# Patient Record
Sex: Female | Born: 1955 | Race: Black or African American | Hispanic: No | Smoking: Never smoker
Health system: Southern US, Community
[De-identification: ages and names within clinical notes are randomized; demographics above are authoritative.]

## PROBLEM LIST (undated history)

## (undated) DIAGNOSIS — Z860100 Personal history of colon polyps, unspecified: Secondary | ICD-10-CM

## (undated) DIAGNOSIS — F419 Anxiety disorder, unspecified: Secondary | ICD-10-CM

## (undated) DIAGNOSIS — E785 Hyperlipidemia, unspecified: Secondary | ICD-10-CM

## (undated) HISTORY — DX: Anxiety disorder, unspecified: F41.9

## (undated) HISTORY — DX: Hyperlipidemia, unspecified: E78.5

## (undated) HISTORY — DX: Personal history of colonic polyps: Z86.010

## (undated) HISTORY — DX: Personal history of colon polyps, unspecified: Z86.0100

---

## 2000-10-03 ENCOUNTER — Encounter: Payer: Self-pay | Admitting: Family Medicine

## 2000-10-03 ENCOUNTER — Encounter: Admission: RE | Admit: 2000-10-03 | Discharge: 2000-10-03 | Payer: Self-pay | Admitting: Family Medicine

## 2001-04-23 ENCOUNTER — Other Ambulatory Visit: Admission: RE | Admit: 2001-04-23 | Discharge: 2001-04-23 | Payer: Self-pay

## 2001-10-29 ENCOUNTER — Ambulatory Visit (HOSPITAL_COMMUNITY): Admission: RE | Admit: 2001-10-29 | Discharge: 2001-10-29 | Payer: Self-pay

## 2001-10-29 ENCOUNTER — Encounter (INDEPENDENT_AMBULATORY_CARE_PROVIDER_SITE_OTHER): Payer: Self-pay | Admitting: Specialist

## 2007-07-18 ENCOUNTER — Other Ambulatory Visit: Admission: RE | Admit: 2007-07-18 | Discharge: 2007-07-18 | Payer: Self-pay | Admitting: Gynecology

## 2007-07-19 ENCOUNTER — Encounter (INDEPENDENT_AMBULATORY_CARE_PROVIDER_SITE_OTHER): Payer: Self-pay | Admitting: Gynecology

## 2007-07-19 ENCOUNTER — Inpatient Hospital Stay (HOSPITAL_COMMUNITY): Admission: AD | Admit: 2007-07-19 | Discharge: 2007-07-19 | Payer: Self-pay | Admitting: Gynecology

## 2007-08-04 ENCOUNTER — Inpatient Hospital Stay (HOSPITAL_COMMUNITY): Admission: AD | Admit: 2007-08-04 | Discharge: 2007-08-06 | Payer: Self-pay | Admitting: Gynecology

## 2007-08-04 ENCOUNTER — Ambulatory Visit: Payer: Self-pay | Admitting: Gynecology

## 2007-08-05 ENCOUNTER — Encounter (INDEPENDENT_AMBULATORY_CARE_PROVIDER_SITE_OTHER): Payer: Self-pay | Admitting: Gynecology

## 2007-09-04 ENCOUNTER — Ambulatory Visit: Payer: Self-pay | Admitting: Gynecology

## 2010-11-22 NOTE — Discharge Summary (Signed)
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   IN HOSPITAL PROCEDURES:  Laparoscopically assisted vaginal hysterectomy  and bilateral salpingo-oophorectomy.   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   HOSPITAL COURSE:  This patient is a 55 year old African-American female,  gravida 2, para 2-0-0-2 who underwent the aforementioned procedure on  August 05, 2007. At surgery, the patient had 2 units of packed red  blood cells administered. The patient had been seen earlier in the past  with a hemoglobin of 4.7 and then 5.3 approximately 4-6 weeks prior. The  patient on admission August 04, 2007 had a hemoglobin of 8.7 and  hematocrit of 28.8. intraoperative course was unremarkable.  Postoperatively she did well. She was afebrile, voided well with  catheter removal, incision was dry, scant vaginal flow. Lungs clear and  calves without tenderness. Her abdomen was soft. The patient was  provided discharge instructions including contacting the office for  temperature elevation of 100.4 degrees Fahrenheit, increasing abdominal  or incisional pain, GI or GU complaints or any other symptomatology. She  was prescribed Vicodin 1-2 every 4-6 h as needed for pain. At this time,  she declined Premarin. The patient will be followed at the GYN clinic  and asked to make an appointment to be seen in 4 weeks. All questions  answered to the satisfaction of the patient. The patient verbalized an  understanding of the same.      Ginger Carne, MD  Electronically  Signed     SHB/MEDQ  D:  08/06/2007  T:  08/06/2007  Job:  [id number]

## 2010-11-22 NOTE — Op Note (Signed)
NAMEREIZY, DUNLOW             ACCOUNT NO.:  [id number]   MEDICAL RECORD NO.:  [id number]          PATIENT TYPE:  INP   LOCATION:  9305                          FACILITY:  [facility]   PHYSICIAN:  Ginger Carne, MD  DATE OF BIRTH:  Oct 09, 1955   DATE OF PROCEDURE:  08/05/2007  DATE OF DISCHARGE:                               OPERATIVE REPORT   PREOPERATIVE DIAGNOSES:  16-week leiomyomatous uterus and  menometrorrhagia.   POSTOPERATIVE DIAGNOSES:  16-week leiomyomatous uterus and  menometrorrhagia.   PROCEDURE:  Laparoscopic-assisted vaginal hysterectomy, bilateral  salpingo-oophorectomy.   SURGEON:  Ginger Carne, MD.   ASSISTANTMichele Mcalpine D. Rose, MD.   ESTIMATED BLOOD LOSS:  500 mL.   COMPLICATIONS:  None immediate.   SPECIMEN:  Uterus and cervix.   DISPOSITION:  To pathology.   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  Both tubes and ovaries were within normal limits.  No other  pathology noted in pelvis. External genitalia, vulva and vagina were  normal.   OPERATIVE PROCEDURE:  The patient prepped and draped in the usual  fashion and placed in the lithotomy position.  Betadine solution used  for antiseptic and the patient was catheterized prior to the procedure.  After adequate general anesthesia, a tenaculum placed on the anterior  lip of the cervix and the [facility] uterine manipulator  placed in the  same.  Afterwards a vertical infraumbilical incision was made and a  Veress needle placed in the abdomen.  Opening and closing pressures were  10-15 mmHg. A needle release trocar placed in the same incision,  laparoscope placed in the trocar sleeve. Two 5 mm ports were made in the  left lower quadrant and left hypogastric regions respectively.  Following this, the ureters were identified bilaterally.  The  infundibulopelvic ligaments were identified and bipolar cauterized and  cut. This also included the round ligaments on either  side.  At this  point,  the vaginal portion of the procedure was conducted.   A double tooth tenaculum placed in the anterior and posterior lips of  the cervix.  Marcaine with epinephrine was used as a paracervical block.  2 cm of anterior and posterior vaginal epithelium were incised  transversely.  Peritoneal reflections identified and opened without  injury to their respective organs. The uterosacral cardinal ligament  complexes were clamped, cut and ligated with #0 Vicryl suture. In a  standard Richardson fashion, the uterine vasculature was clamped, cut  and ligated with #0 Vicryl suture including the broad ligaments. At this  point, the corpus of the uterus, cervix, both tubes and ovaries were  removed.  Bleeding points hemostatically checked, blood clots removed.  Closure of the cuff in one layer with #0 Vicryl running interlocking  suture. Angled sutures placed separately and sutures on the cuff were  started at the angles and worked to the midline for two separate suture  closures.  At this point, reinspection of the pelvis by laparoscopy was  carried out, irrigation utilized, bleeding points hemostatically  checked. The pelvis was dry, no active bleeding noted. Pedicles were  dry, irrigant removed, gas released, trocars removed.  Closure of the 10  mm fascia site with #0 Vicryl suture and a 4-0 Vicryl for subcuticular  closure. Instrument and sponge count were correct.  The patient  tolerated the procedure well and returned to post anesthesia recovery  room in excellent condition.      Ginger Carne, MD  Electronically Signed     SHB/MEDQ  D:  08/05/2007  T:  08/05/2007  Job:  [id number]

## 2010-11-25 NOTE — Op Note (Signed)
[facility]  Patient:    LASHAUNDRA, LEHRMANN Visit Number: [id number] MRN: [id number]          Service Type: DSU Location: [facility] Attending Physician:  Barbaraann Cao Dictated by:   Ronda Fairly. Galen Daft, M.D. Proc. Date: 10/29/01 Admit Date:  10/29/2001                             Operative Report  PREOPERATIVE DIAGNOSES:       1. Menometrorrhagia.                               2. Fibroids.  POSTOPERATIVE DIAGNOSES:      1. Menometrorrhagia.                               2. Fibroids.                               3. Uterine polyp.  OPERATION/PROCEDURE:          Diagnostic hysteroscopy with resection of polyp and uterine curettage.  SURGEON:                      Ronda Fairly. Galen Daft, M.D.  COMPLICATIONS:                None.  ESTIMATED BLOOD LOSS:         Minimal.  PROCEDURE:                    The patient was identified as Nyoka Lint. Informed consent was obtained prior to the procedure.  She was brought to the operating room.  The cervix was dilated to accept the hysteroscope.  The sound showed the uterus to be 13 cm in depth.  The hysteroscope was placed in and both fallopian tube ostia were identified.  Photographs were taken.  There was polypoid-like tissue on the posterior aspect of the uterus, otherwise unremarkable.  Specimen was taken from there.  Also, curettage was carried out throughout.  There were no complications.  It was tolerated well.  Total fluid used was 120 cc, and the patient had no complications.  All instrument, sponge, and needle counts were correct at the end of the case.  The patient had a uterine curettage of all quadrants of the uterus and the sample was sent separately from the hysteroscopy sample.  No complications. Dictated by:   Ronda Fairly. Galen Daft, M.D. Attending Physician:  Barbaraann Cao DD:  10/29/01 TD:  10/30/01 Job: [id number]

## 2011-03-29 LAB — CBC
HCT: 20.9 — ABNORMAL LOW
Hemoglobin: 5.9 — CL
MCHC: 28.1 — ABNORMAL LOW
MCV: 51.9 — ABNORMAL LOW
Platelets: 175
RBC: 4.03
RDW: 21.7 — ABNORMAL HIGH
WBC: 6.5

## 2011-03-29 LAB — TYPE AND SCREEN
ABO/RH(D): O POS
Antibody Screen: NEGATIVE

## 2011-03-29 LAB — FERRITIN: Ferritin: 4 — ABNORMAL LOW (ref 10–291)

## 2011-03-29 LAB — ABO/RH: ABO/RH(D): O POS

## 2011-03-30 LAB — CBC
HCT: 27.1 — ABNORMAL LOW
HCT: 28.8 — ABNORMAL LOW
HCT: 32.1 — ABNORMAL LOW
Hemoglobin: 8.4 — ABNORMAL LOW
Hemoglobin: 8.7 — ABNORMAL LOW
Hemoglobin: 9.9 — ABNORMAL LOW
MCHC: 30.2
MCHC: 30.8
MCHC: 31.1
MCV: 65.6 — ABNORMAL LOW
MCV: 70.7 — ABNORMAL LOW
MCV: 70.9 — ABNORMAL LOW
Platelets: 434 — ABNORMAL HIGH
Platelets: 469 — ABNORMAL HIGH
Platelets: 492 — ABNORMAL HIGH
RBC: 3.83 — ABNORMAL LOW
RBC: 4.39
RBC: 4.54
RDW: 38.9 — ABNORMAL HIGH
RDW: 39.2 — ABNORMAL HIGH
RDW: 40.8 — ABNORMAL HIGH
WBC: 6.1
WBC: 6.4
WBC: 8.9

## 2011-03-30 LAB — BASIC METABOLIC PANEL
BUN: 16
BUN: 17
BUN: 6
CO2: 25
CO2: 25
CO2: 31
Calcium: 8.3 — ABNORMAL LOW
Calcium: 8.9
Calcium: 9.2
Chloride: 103
Chloride: 107
Chloride: 109
Creatinine, Ser: 0.68
Creatinine, Ser: 0.84
Creatinine, Ser: 0.94
GFR calc Af Amer: >60
GFR calc Af Amer: >60
GFR calc Af Amer: >60
GFR calc non Af Amer: >60
GFR calc non Af Amer: >60
GFR calc non Af Amer: >60
Glucose, Bld: 113 — ABNORMAL HIGH
Glucose, Bld: 130 — ABNORMAL HIGH
Glucose, Bld: 90
Potassium: 3.7
Potassium: 3.9
Potassium: 4.2
Sodium: 136
Sodium: 138
Sodium: 141

## 2011-03-30 LAB — CROSSMATCH
ABO/RH(D): O POS
Antibody Screen: NEGATIVE

## 2013-07-31 ENCOUNTER — Telehealth: Payer: Self-pay

## 2013-07-31 NOTE — Telephone Encounter (Signed)
Patient would like Dr. Audria NineMcPherson to call her. She is having neck pain and would like some advice as to what to do for it. She has an appointment for a CPE next month but she cannot wait that long to discuss her symptoms. I let her know we would not be able to give her any medical advice over the phone without an OV, but the patient would like Dr. Leonia ReaderMc Pherson to call her anyway.

## 2013-07-31 NOTE — Telephone Encounter (Signed)
I have tried the phone number we have listed for home; it is not a valid number. This person is not established with us; if she needs evaluation, walk-in @ [facility] is advised.

## 2013-08-04 NOTE — Telephone Encounter (Signed)
Invalid number. Patient not established with us. Cannot do medical advice over phone.

## 2013-08-21 ENCOUNTER — Ambulatory Visit: Payer: Self-pay | Admitting: Family Medicine

## 2013-09-29 ENCOUNTER — Other Ambulatory Visit (HOSPITAL_COMMUNITY)
Admission: RE | Admit: 2013-09-29 | Discharge: 2013-09-29 | Disposition: A | Discharge status: Home / Self Care | Payer: 59 | Source: Ambulatory Visit | Attending: Family Medicine | Admitting: Family Medicine

## 2013-09-29 ENCOUNTER — Other Ambulatory Visit: Payer: Self-pay | Admitting: Family Medicine

## 2013-09-29 DIAGNOSIS — Z01419 Encounter for gynecological examination (general) (routine) without abnormal findings: Secondary | ICD-10-CM | POA: Insufficient documentation

## 2013-09-30 ENCOUNTER — Other Ambulatory Visit: Payer: Self-pay

## 2013-09-30 DIAGNOSIS — Z1231 Encounter for screening mammogram for malignant neoplasm of breast: Secondary | ICD-10-CM

## 2013-10-08 ENCOUNTER — Ambulatory Visit: Payer: Self-pay

## 2013-12-10 ENCOUNTER — Ambulatory Visit
Admission: RE | Admit: 2013-12-10 | Discharge: 2013-12-10 | Disposition: A | Discharge status: Home / Self Care | Payer: 59 | Source: Ambulatory Visit

## 2013-12-10 ENCOUNTER — Encounter (INDEPENDENT_AMBULATORY_CARE_PROVIDER_SITE_OTHER): Payer: Self-pay

## 2013-12-10 DIAGNOSIS — Z1231 Encounter for screening mammogram for malignant neoplasm of breast: Secondary | ICD-10-CM

## 2015-03-10 ENCOUNTER — Other Ambulatory Visit: Payer: Self-pay

## 2015-03-10 DIAGNOSIS — Z1231 Encounter for screening mammogram for malignant neoplasm of breast: Secondary | ICD-10-CM

## 2015-04-01 ENCOUNTER — Ambulatory Visit: Payer: 59

## 2015-07-20 ENCOUNTER — Ambulatory Visit
Admission: RE | Admit: 2015-07-20 | Discharge: 2015-07-20 | Disposition: A | Discharge status: Home / Self Care | Payer: 59 | Source: Ambulatory Visit

## 2015-07-20 DIAGNOSIS — Z1231 Encounter for screening mammogram for malignant neoplasm of breast: Secondary | ICD-10-CM

## 2016-07-25 ENCOUNTER — Other Ambulatory Visit: Payer: Self-pay | Admitting: Family Medicine

## 2016-07-25 DIAGNOSIS — Z1231 Encounter for screening mammogram for malignant neoplasm of breast: Secondary | ICD-10-CM

## 2016-08-07 ENCOUNTER — Encounter: Payer: Self-pay | Admitting: Radiology

## 2016-08-07 ENCOUNTER — Ambulatory Visit
Admission: RE | Admit: 2016-08-07 | Discharge: 2016-08-07 | Disposition: A | Discharge status: Home / Self Care | Payer: 59 | Source: Ambulatory Visit | Attending: Family Medicine | Admitting: Family Medicine

## 2016-08-07 DIAGNOSIS — Z1231 Encounter for screening mammogram for malignant neoplasm of breast: Secondary | ICD-10-CM

## 2016-11-21 ENCOUNTER — Institutional Professional Consult (permissible substitution): Payer: 59 | Admitting: Internal Medicine

## 2016-12-27 ENCOUNTER — Encounter: Payer: Self-pay | Admitting: Internal Medicine

## 2016-12-27 ENCOUNTER — Ambulatory Visit (INDEPENDENT_AMBULATORY_CARE_PROVIDER_SITE_OTHER): Payer: Commercial Managed Care - PPO | Admitting: Internal Medicine

## 2016-12-27 ENCOUNTER — Other Ambulatory Visit (INDEPENDENT_AMBULATORY_CARE_PROVIDER_SITE_OTHER): Payer: Commercial Managed Care - PPO

## 2016-12-27 ENCOUNTER — Ambulatory Visit (INDEPENDENT_AMBULATORY_CARE_PROVIDER_SITE_OTHER)
Admission: RE | Admit: 2016-12-27 | Discharge: 2016-12-27 | Disposition: A | Discharge status: Home / Self Care | Payer: Commercial Managed Care - PPO | Source: Ambulatory Visit | Attending: Internal Medicine | Admitting: Internal Medicine

## 2016-12-27 VITALS — BP 124/78 | HR 74 | Ht 67.0 in | Wt 174.0 lb

## 2016-12-27 DIAGNOSIS — J45991 Cough variant asthma: Secondary | ICD-10-CM

## 2016-12-27 DIAGNOSIS — R05 Cough: Secondary | ICD-10-CM | POA: Diagnosis not present

## 2016-12-27 LAB — CBC WITH DIFFERENTIAL/PLATELET
Basophils Absolute: 0.1 10*3/uL (ref 0.0–0.1)
Basophils Relative: 0.8 % (ref 0.0–3.0)
Eosinophils Absolute: 0.2 10*3/uL (ref 0.0–0.7)
Eosinophils Relative: 2.5 % (ref 0.0–5.0)
HCT: 43.8 % (ref 36.0–46.0)
Hemoglobin: 14.3 g/dL (ref 12.0–15.0)
Lymphocytes Relative: 25.2 % (ref 12.0–46.0)
Lymphs Abs: 1.8 10*3/uL (ref 0.7–4.0)
MCHC: 32.7 g/dL (ref 30.0–36.0)
MCV: 83.3 fl (ref 78.0–100.0)
Monocytes Absolute: 0.5 10*3/uL (ref 0.1–1.0)
Monocytes Relative: 7.2 % (ref 3.0–12.0)
Neutro Abs: 4.6 10*3/uL (ref 1.4–7.7)
Neutrophils Relative %: 64.3 % (ref 43.0–77.0)
Platelets: 273 10*3/uL (ref 150.0–400.0)
RBC: 5.25 Mil/uL — ABNORMAL HIGH (ref 3.87–5.11)
RDW: 12.6 % (ref 11.5–15.5)
WBC: 7.1 10*3/uL (ref 4.0–10.5)

## 2016-12-27 LAB — NITRIC OXIDE: Nitric Oxide: 16

## 2016-12-27 MED ORDER — FAMOTIDINE 20 MG PO TABS
ORAL_TABLET | ORAL | 11 refills | Status: DC
Start: 2016-12-27 — End: 2017-04-25

## 2016-12-27 MED ORDER — PANTOPRAZOLE SODIUM 40 MG PO TBEC
40.0000 mg | DELAYED_RELEASE_TABLET | Freq: Every day | ORAL | 2 refills | Status: DC
Start: 2016-12-27 — End: 2017-04-25

## 2016-12-27 NOTE — Progress Notes (Signed)
Subjective:     Patient ID: Gabriella Greene, female   DOB: 1956/06/08,    MRN: [id number]  HPI  [id number] yobf never smoker never significant resp problems with new onset cough around summer 2016 referred to pulmonary clinic 12/27/2016 by Gabriella   Wynelle Greene    12/27/2016 1st [facility] office visit/ Gabriella Greene   Chief Complaint  Patient presents with  . Pulmonary Consult    Referred by Gabriella. Deatra JamesVyvyan Greene. She c/o cough for the past 2 yrs. Cough is occ prod but she swallows mucus. 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  She occ is awakened by the cough but not often.   onset was abrupt x 4-5  years prior to OV  Can be very severe but waxes and wanes and better x one month but still present day > noct   [place] terrier x 2.5 y     Kouffman Reflux v Neurogenic Cough Differentiator Reflux Comments  Do you awaken from a sound sleep coughing violently?                            With trouble breathing? 2 x weekly no   Do you have choking episodes when you cannot  Get enough air, gasping for air ?              Yes   Do you usually cough when you lie down into  The bed, or when you just lie down to rest ?                          No    Do you usually cough after meals or eating?         no   Do you cough when (or after) you bend over?    no   GERD SCORE     Kouffman Reflux v Neurogenic Cough Differentiator Neurogenic   Do you more-or-less cough all day long? Sporadic Worse at worse /dillards   Does change of temperature make you cough? no   Does laughing or chuckling cause you to cough? no   Do fumes (perfume, automobile fumes, burned  Toast, etc.,) cause you to cough ?      no   Does speaking, singing, or talking on the phone cause you to cough   ?               No    Neurogenic/Airway score       No obvious day to day or daytime variability or assoc excess/ purulent sputum or mucus plugs or hemoptysis or cp or chest tightness, subjective wheeze or overt sinus or hb symptoms. No unusual exp hx  or h/o childhood pna/ asthma or knowledge of premature birth.  Sleeping ok without nocturnal  or early am exacerbation  of respiratory  c/o's or need for noct saba. Also denies any obvious fluctuation of symptoms with weather or environmental changes or other aggravating or alleviating factors except as outlined above   Current Medications, Allergies, Complete Past Medical History, Past Surgical History, Family History, and Social History were reviewed in Gabriella Greene electronic medical record.  ROS  The following are not active complaints unless bolded sore throat, dysphagia, dental problems, itching, sneezing,  nasal congestion or excess/ purulent secretions, ear ache,   fever, chills, sweats, unintended wt loss, classically pleuritic or exertional cp,  orthopnea pnd or leg swelling,  presyncope, palpitations, abdominal pain, anorexia, nausea, vomiting, diarrhea  or change in bowel or bladder habits, change in stools or urine, dysuria,hematuria,  rash, arthralgias, visual complaints, headache, numbness, weakness or ataxia or problems with walking or coordination,  change in mood/affect or memory.          Review of Systems     Objective:   Physical Exam  amb bf nad   Wt Readings from Last 3 Encounters:  12/27/16 174 lb (78.9 kg)    Vital signs reviewed  - Note on arrival 02 sats  96% on RA     HEENT: nl dentition, turbinates bilaterally, and oropharynx. Severe dry wax/  without cough reflex   NECK :  without JVD/Nodes/TM/ nl carotid upstrokes bilaterally   LUNGS: no acc muscle use,  Nl contour chest which is clear to A and P bilaterally without cough on insp or exp maneuvers   CV:  RRR  no s3 or murmur or increase in P2, and no edema   ABD:  soft and nontender with nl inspiratory excursion in the supine position. No bruits or organomegaly appreciated, bowel sounds nl  MS:  Nl gait/ ext warm without deformities, calf tenderness, cyanosis or clubbing No obvious joint  restrictions   SKIN: warm and dry without lesions    NEURO:  alert, approp, nl sensorium with  no motor or cerebellar deficits apparent.     CXR PA and Lateral:   12/27/2016 :    I personally reviewed images and agree with radiology impression as follows:    Borderline cardiomegaly.  No acute pulmonary disease.  .Labs ordered 12/27/2016   Allergy profile     Assessment:

## 2016-12-27 NOTE — Patient Instructions (Addendum)
For drainage / throat tickle try take CHLORPHENIRAMINE  4 mg - take one every 4 hours as needed - available over the counter- may cause drowsiness so start with just a bedtime dose or two and see how you tolerate it before trying in daytime  - if can't tolerate the chlorpheniramine then try zyrtec   Pantoprazole (protonix) 40 mg   Take  30-60 min before first meal of the day and Pepcid (famotidine)  20 mg one @  bedtime until return to office - this is the best way to tell whether stomach acid is contributing to your problem.    GERD (REFLUX)  is an extremely common cause of respiratory symptoms just like yours , many times with no obvious heartburn at all.    It can be treated with medication, but also with lifestyle changes including elevation of the head of your bed (ideally with 6 inch  bed blocks),  Smoking cessation, avoidance of late meals, excessive alcohol, and avoid fatty foods, chocolate, peppermint, colas, red wine, and acidic juices such as orange juice.  NO MINT OR MENTHOL PRODUCTS SO NO COUGH DROPS   USE SUGARLESS CANDY INSTEAD (Jolley ranchers or Stover's or Life Savers) or even ice chips will also do - the key is to swallow to prevent all throat clearing. NO OIL BASED VITAMINS - use powdered substitutes - stop fish oil for now and eat more fish!    Please remember to go to the lab and x-ray department downstairs in the basement  for your tests - we will call you with the results when they are available.    Please schedule a follow up office visit in 6 weeks, call sooner if needed

## 2016-12-28 LAB — RESPIRATORY ALLERGY PROFILE REGION II ~~LOC~~
Allergen, A. alternata, m6: <0.1 kU/L
Allergen, C. Herbarum, M2: <0.1 kU/L
Allergen, Cedar tree, t12: <0.1 kU/L
Allergen, Comm Silver Birch, t9: <0.1 kU/L
Allergen, Cottonwood, t14: <0.1 kU/L
Allergen, D pternoyssinus,d7: 0.11 kU/L — ABNORMAL HIGH
Allergen, Mouse Urine Protein, e78: <0.1 kU/L
Allergen, Mulberry, t76: <0.1 kU/L
Allergen, Oak,t7: 0.16 kU/L — ABNORMAL HIGH
Allergen, P. notatum, m1: <0.1 kU/L
Aspergillus fumigatus, m3: <0.1 kU/L
Bermuda Grass: 0.11 kU/L — ABNORMAL HIGH
Box Elder IgE: <0.1 kU/L
Cat Dander: <0.1 kU/L
Cockroach: 0.12 kU/L — ABNORMAL HIGH
Common Ragweed: <0.1 kU/L
D. farinae: <0.1 kU/L
Dog Dander: <0.1 kU/L
Elm IgE: 0.1 kU/L — ABNORMAL HIGH
IgE (Immunoglobulin E), Serum: 60 kU/L (ref <115)
Johnson Grass: <0.1 kU/L
Pecan/Hickory Tree IgE: <0.1 kU/L
Rough Pigweed  IgE: <0.1 kU/L
Sheep Sorrel IgE: <0.1 kU/L
Timothy Grass: <0.1 kU/L

## 2016-12-28 NOTE — Assessment & Plan Note (Addendum)
FENO 12/27/2016  =   16 - Spirometry 12/27/2016  No obst  - Allergy profile 12/27/2016 >  Eos 0.2 /  IgE   pending   The most common causes of chronic cough in immunocompetent adults include the following: upper airway cough syndrome (UACS), previously referred to as postnasal drip syndrome (PNDS), which is caused by variety of rhinosinus conditions; (2) asthma; (3) GERD; (4) chronic bronchitis from cigarette smoking or other inhaled environmental irritants; (5) nonasthmatic eosinophilic bronchitis; and (6) bronchiectasis.   These conditions, singly or in combination, have accounted for up to 94% of the causes of chronic cough in prospective studies.   Other conditions have constituted no >6% of the causes in prospective studies These have included bronchogenic carcinoma, chronic interstitial pneumonia, sarcoidosis, left ventricular failure, ACEI-induced cough, and aspiration from a condition associated with pharyngeal dysfunction.    Chronic cough is often simultaneously caused by more than one condition. A single cause has been found from 38 to 82% of the time, multiple causes from 18 to 62%. Multiply caused cough has been the result of three diseases up to 42% of the time.       Most likely this is not asthma but rather  Upper airway cough syndrome (previously labeled PNDS) , is  so named because it's frequently impossible to sort out how much is  CR/sinusitis with freq throat clearing (which can be related to primary GERD)   vs  causing  secondary (" extra esophageal")  GERD from wide swings in gastric pressure that occur with throat clearing, often  promoting self use of mint and menthol lozenges that reduce the lower esophageal sphincter tone and exacerbate the problem further in a cyclical fashion.   These are the same pts (now being labeled as having "irritable larynx syndrome" by some cough centers) who not infrequently have a history of having failed to tolerate ace inhibitors,  dry powder  inhalers or biphosphonates or report having atypical/extraesophageal reflux symptoms that don't respond to standard doses of PPI  and are easily confused as having aecopd or asthma flares by even experienced allergists/ pulmonologists (myself included).  Of the three most common causes of  Sub-acute or recurrent or chronic cough, only one (GERD)  can actually contribute to/ trigger  the other two (asthma and post nasal drip syndrome)  and perpetuate the cylce of cough.  While not intuitively obvious, many patients with chronic low grade reflux do not cough until there is a primary insult that disturbs the protective epithelial barrier and exposes sensitive nerve endings.   This is typically viral but can be direct physical injury such as with an endotracheal tube.   The point is that once this occurs, it is difficult to eliminate the cycle  using anything but a maximally effective acid suppression regimen at least in the short run, accompanied by an appropriate diet to address non acid GERD and control / eliminate the cough itself for at least 3 days.    Will try max rx for gerd/pnds with 1st gen h1 prn  x 6 weeks then regroup with allergy w/u in meantime if indicated by intial screening    Advised: The standardized cough guidelines published in Chest by Stark Fallsichard Irwin in 2006 are still the best available and consist of a multiple step process (up to 12!) , not a single office visit,  and are intended  to address this problem logically,  with an alogrithm dependent on response to empiric treatment at  each  progressive step  to determine a specific diagnosis with  minimal addtional testing needed. Therefore if adherence is an issue or can't be accurately verified,  it's very unlikely the standard evaluation and treatment will be successful here.    Furthermore, response to therapy (other than acute cough suppression, which should only be used short term with avoidance of narcotic containing cough syrups if  possible), can be a gradual process for which the patient is not likely to  perceive immediate benefit.  Unlike going to an eye doctor where the best perscription is almost always the first one and is immediately effective, this is almost never the case in the management of chronic cough syndromes. Therefore the patient needs to commit up front to consistently adhere to recommendations  for up to 6 weeks of therapy directed at the likely underlying problem(s) before the response can be reasonably evaluated.    Total time devoted to counseling  > 50 % of initial 60 min office visit:  review case with pt/ discussion of options/alternatives/ personally creating written customized instructions  in presence of pt  then going over those specific  Instructions directly with the pt including how to use all of the meds but in particular covering each new medication in detail and the difference between the maintenance= "automatic" meds and the prns using an action plan format for the latter (If this problem/symptom => do that organization reading Left to right).  Please see AVS from this visit for a full list of these instructions which I personally wrote for this pt and  are unique to this visit.

## 2016-12-28 NOTE — Progress Notes (Signed)
Spoke with pt and notified of results per Dr. Wert. Pt verbalized understanding and denied any questions. 

## 2017-01-01 NOTE — Progress Notes (Signed)
LMTCB

## 2017-01-02 NOTE — Progress Notes (Signed)
LMTCB

## 2017-01-03 NOTE — Progress Notes (Signed)
LMTCB

## 2017-01-04 ENCOUNTER — Encounter: Payer: Self-pay | Admitting: *Deleted

## 2017-01-04 NOTE — Progress Notes (Signed)
Letter mailed to the pt. 

## 2017-01-12 ENCOUNTER — Telehealth: Payer: Self-pay | Admitting: Internal Medicine

## 2017-01-12 NOTE — Telephone Encounter (Signed)
Notes recorded by Nyoka CowdenWert, Michael B, MD on 01/01/2017 at 8:47 AM EDT Call patient : Studies are pos mild allergy to  trees/grass/ dust > avoidance main rx. Be sure patient has f/u ov so we can go over all the details of this study and get a plan together moving forward - ok to move up f/u if not feeling better and wants to be seen sooner   Left message for patient with results.

## 2017-02-14 ENCOUNTER — Ambulatory Visit: Payer: Commercial Managed Care - PPO | Admitting: Internal Medicine

## 2017-03-14 DIAGNOSIS — H2513 Age-related nuclear cataract, bilateral: Secondary | ICD-10-CM | POA: Diagnosis not present

## 2017-03-14 DIAGNOSIS — S0501XA Injury of conjunctiva and corneal abrasion without foreign body, right eye, initial encounter: Secondary | ICD-10-CM | POA: Diagnosis not present

## 2017-04-25 ENCOUNTER — Encounter: Payer: Self-pay | Admitting: Internal Medicine

## 2017-04-25 ENCOUNTER — Ambulatory Visit (INDEPENDENT_AMBULATORY_CARE_PROVIDER_SITE_OTHER): Payer: Commercial Managed Care - PPO | Admitting: Internal Medicine

## 2017-04-25 ENCOUNTER — Ambulatory Visit: Payer: Commercial Managed Care - PPO | Admitting: Internal Medicine

## 2017-04-25 VITALS — BP 124/80 | HR 66 | Ht 67.0 in | Wt 168.4 lb

## 2017-04-25 DIAGNOSIS — J45991 Cough variant asthma: Secondary | ICD-10-CM

## 2017-04-25 NOTE — Progress Notes (Signed)
Subjective:     Patient ID: Gabriella Greene, female   DOB: 09/23/1955,    MRN: [id number]    Brief patient profile:  [age] yobf never smoker never significant resp problems with new onset cough around summer 2016 referred to pulmonary clinic 12/27/2016 by Dr   Gabriella Greene     History of Present Illness  12/27/2016 1st [facility] office visit/ Gabriella Greene   Chief Complaint  Patient presents with  . Pulmonary Consult    Referred by Dr. Deatra JamesVyvyan Greene. She c/o cough for the past 2 yrs. Cough is occ prod but she swallows mucus. 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  She occ is awakened by the cough but not often.   onset was abrupt x 4-5  years prior to OV  Can be very severe but waxes and wanes and better x one month but still present day > noct   [place] terrier x 2.5 y   Kouffman Reflux v Neurogenic Cough Differentiator Reflux Comments  Do you awaken from a sound sleep coughing violently?                            With trouble breathing? 2 x weekly no   Do you have choking episodes when you cannot  Get enough air, gasping for air ?              Yes   Do you usually cough when you lie down into  The bed, or when you just lie down to rest ?                          No    Do you usually cough after meals or eating?         no   Do you cough when (or after) you bend over?    no   GERD SCORE     Kouffman Reflux v Neurogenic Cough Differentiator Neurogenic   Do you more-or-less cough all day long? Sporadic Worse at worse /dillards   Does change of temperature make you cough? no   Does laughing or chuckling cause you to cough? no   Do fumes (perfume, automobile fumes, burned  Toast, etc.,) cause you to cough ?      no   Does speaking, singing, or talking on the phone cause you to cough   ?               No    Neurogenic/Airway score    rec  For drainage / throat tickle try take CHLORPHENIRAMINE  4 mg - take one every 4 hours as needed - available over the counter- may cause drowsiness so  start with just a bedtime dose or two and see how you tolerate it before trying in daytime  - if can't tolerate the chlorpheniramine then try zyrtec  Pantoprazole (protonix) 40 mg   Take  30-60 min before first meal of the day and Pepcid (famotidine)  20 mg one @  bedtime until return to office - this is the best way to tell whether stomach acid is contributing to your problem.  GERD diet   Please remember to go to the lab and x-ray department downstairs in the basement  for your tests - we will call you with the results when they are available.    04/25/2017  f/u ov/Gabriella Greene re: chronic cough resolved on gerd  rx/ diet  Chief Complaint  Patient presents with  . Follow-up    Cough has resolved. No new co's.    stopped acid suppression p rx x one month and followed diet including no fish oil an chronic (> 1 y) cough  resolved and did not recur   Not limited by breathing from desired activities  No obvious day to day or daytime variability or assoc excess/ purulent sputum or mucus plugs or hemoptysis or cp or chest tightness, subjective wheeze or overt sinus or hb symptoms. No unusual exp hx or h/o childhood pna/ asthma or knowledge of premature birth.  Sleeping ok flat without nocturnal  or early am exacerbation  of respiratory  c/o's or need for noct saba. Also denies any obvious fluctuation of symptoms with weather or environmental changes or other aggravating or alleviating factors except as outlined above   Current Allergies, Complete Past Medical History, Past Surgical History, Family History, and Social History were reviewed in [employer] record.  ROS  The following are not active complaints unless bolded Hoarseness, sore throat, dysphagia, dental problems, itching, sneezing,  nasal congestion or discharge of excess mucus or purulent secretions, ear ache,   fever, chills, sweats, unintended wt loss or wt gain, classically pleuritic or exertional cp,  orthopnea pnd or leg  swelling, presyncope, palpitations, abdominal pain, anorexia, nausea, vomiting, diarrhea  or change in bowel habits or change in bladder habits, change in stools or change in urine, dysuria, hematuria,  rash, arthralgias, visual complaints, headache, numbness, weakness or ataxia or problems with walking or coordination,  change in mood/affect or memory.        Current Meds  Medication Sig  . Cholecalciferol (VITAMIN D PO) Take 1 tablet by mouth daily.  . Multiple Vitamin (MULTIVITAMIN) tablet Take 1 tablet by mouth daily.               Objective:   Physical Exam  amb bf nad    04/25/2017     168   12/27/16 174 lb (78.9 kg)    Vital signs reviewed  - Note on arrival 02 sats  97% on RA     HEENT: nl dentition, turbinates bilaterally, and oropharynx. Severe dry wax/  without cough reflex   NECK :  without JVD/Nodes/TM/ nl carotid upstrokes bilaterally   LUNGS: no acc muscle use,  Nl contour chest which is clear to A and P bilaterally without cough on insp or exp maneuvers   CV:  RRR  no s3 or murmur or increase in P2, and no edema   ABD:  soft and nontender with nl inspiratory excursion in the supine position. No bruits or organomegaly appreciated, bowel sounds nl  MS:  Nl gait/ ext warm without deformities, calf tenderness, cyanosis or clubbing No obvious joint restrictions   SKIN: warm and dry without lesions    NEURO:  alert, approp, nl sensorium with  no motor or cerebellar deficits apparent.     CXR PA and Lateral:   12/27/2016 :    I personally reviewed images and agree with radiology impression as follows:    Borderline cardiomegaly.  No acute pulmonary disease.       Assessment:

## 2017-04-25 NOTE — Assessment & Plan Note (Signed)
FENO 12/27/2016  =   16 - Spirometry 12/27/2016  No obst  - Allergy profile 12/27/2016 >  Eos 0.2 /  IgE   60 RAST pos trees/grass/ dust    Complete resolution of cough though def risk of recurrence  Reviewed: Of the three most common causes of  Sub-acute or recurrent or chronic cough, only one (GERD)  can actually contribute to/ trigger  the other two (asthma and post nasal drip syndrome)  and perpetuate the cylce of cough.  While not intuitively obvious, many patients with chronic low grade reflux do not cough until there is a primary insult that disturbs the protective epithelial barrier and exposes sensitive nerve endings.   This is typically viral or allergic (rhinitis with pnds)  but can also  be direct physical injury such as with an endotracheal tube.   The point is that once this occurs, it is difficult to eliminate the cycle  using anything but a maximally effective acid suppression regimen at least in the short run, accompanied by an appropriate diet to address non acid GERD and control / eliminate the pnds with 1st gen H1 blockers per guidelines     Pulmonary f/u can be prn  - ok to resume fish oil but stop if actively coughing    Each maintenance medication was reviewed in detail including most importantly the difference between maintenance and as needed and under what circumstances the prns are to be used.  Please see AVS for specific  Instructions which are unique to this visit and I personally typed out  which were reviewed in detail in writing with the patient and a copy provided.

## 2017-04-25 NOTE — Patient Instructions (Signed)
Ok to fish oil unless you are coughing   At the onset of cough for any reason:  Try prilosec otc 20mg   Take 30-60 min before first meal of the day and Pepcid ac (famotidine) 20 mg one @  bedtime until cough is completely gone for at least a week without the need for cough suppression    And For drainage / throat tickle try take CHLORPHENIRAMINE  4 mg - take one every 4 hours as needed - available over the counter- may cause drowsiness so start with just a bedtime dose or two and see how you tolerate it before trying in daytime      If you are satisfied with your treatment plan,  let your doctor know and he/she can either refill your medications or you can return here when your prescription runs out.     If in any way you are not 100% satisfied,  please tell us.  If 100% better, tell your friends!  Pulmonary follow up is as needed

## 2017-07-10 DIAGNOSIS — Z760 Encounter for issue of repeat prescription: Secondary | ICD-10-CM | POA: Diagnosis not present

## 2017-08-27 DIAGNOSIS — B301 Conjunctivitis due to adenovirus: Secondary | ICD-10-CM | POA: Diagnosis not present

## 2017-08-27 DIAGNOSIS — H2513 Age-related nuclear cataract, bilateral: Secondary | ICD-10-CM | POA: Diagnosis not present

## 2017-08-29 ENCOUNTER — Other Ambulatory Visit: Payer: Self-pay | Admitting: Family Medicine

## 2017-08-29 DIAGNOSIS — Z1231 Encounter for screening mammogram for malignant neoplasm of breast: Secondary | ICD-10-CM

## 2017-10-10 ENCOUNTER — Ambulatory Visit: Payer: Commercial Managed Care - PPO

## 2017-10-31 ENCOUNTER — Ambulatory Visit
Admission: RE | Admit: 2017-10-31 | Discharge: 2017-10-31 | Disposition: A | Discharge status: Home / Self Care | Payer: Commercial Managed Care - PPO | Source: Ambulatory Visit | Attending: Family Medicine | Admitting: Family Medicine

## 2017-10-31 DIAGNOSIS — Z1231 Encounter for screening mammogram for malignant neoplasm of breast: Secondary | ICD-10-CM | POA: Diagnosis not present

## 2017-11-02 DIAGNOSIS — Z0000 Encounter for general adult medical examination without abnormal findings: Secondary | ICD-10-CM | POA: Diagnosis not present

## 2017-11-02 DIAGNOSIS — E785 Hyperlipidemia, unspecified: Secondary | ICD-10-CM | POA: Diagnosis not present

## 2017-11-02 DIAGNOSIS — H6123 Impacted cerumen, bilateral: Secondary | ICD-10-CM | POA: Diagnosis not present

## 2018-02-27 DIAGNOSIS — R1031 Right lower quadrant pain: Secondary | ICD-10-CM | POA: Diagnosis not present

## 2018-02-28 ENCOUNTER — Ambulatory Visit
Admission: RE | Admit: 2018-02-28 | Discharge: 2018-02-28 | Disposition: A | Discharge status: Home / Self Care | Payer: Commercial Managed Care - PPO | Source: Ambulatory Visit | Attending: Family Medicine | Admitting: Family Medicine

## 2018-02-28 ENCOUNTER — Other Ambulatory Visit: Payer: Self-pay | Admitting: Family Medicine

## 2018-02-28 DIAGNOSIS — R1031 Right lower quadrant pain: Secondary | ICD-10-CM

## 2018-02-28 DIAGNOSIS — M25551 Pain in right hip: Secondary | ICD-10-CM | POA: Diagnosis not present

## 2018-03-06 ENCOUNTER — Other Ambulatory Visit: Payer: Self-pay | Admitting: Family Medicine

## 2018-03-06 DIAGNOSIS — R1031 Right lower quadrant pain: Secondary | ICD-10-CM

## 2018-07-24 DIAGNOSIS — Z760 Encounter for issue of repeat prescription: Secondary | ICD-10-CM | POA: Diagnosis not present

## 2018-09-18 DIAGNOSIS — H2513 Age-related nuclear cataract, bilateral: Secondary | ICD-10-CM | POA: Diagnosis not present

## 2018-09-18 DIAGNOSIS — B301 Conjunctivitis due to adenovirus: Secondary | ICD-10-CM | POA: Diagnosis not present

## 2019-09-24 ENCOUNTER — Other Ambulatory Visit: Payer: Self-pay | Admitting: Family Medicine

## 2019-09-24 DIAGNOSIS — Z1231 Encounter for screening mammogram for malignant neoplasm of breast: Secondary | ICD-10-CM

## 2019-10-15 ENCOUNTER — Ambulatory Visit
Admission: RE | Admit: 2019-10-15 | Discharge: 2019-10-15 | Disposition: A | Discharge status: Home / Self Care | Payer: Commercial Managed Care - PPO | Source: Ambulatory Visit | Attending: Family Medicine | Admitting: Family Medicine

## 2019-10-15 ENCOUNTER — Ambulatory Visit: Payer: Commercial Managed Care - PPO

## 2019-10-15 ENCOUNTER — Other Ambulatory Visit: Payer: Self-pay

## 2019-10-15 DIAGNOSIS — Z1231 Encounter for screening mammogram for malignant neoplasm of breast: Secondary | ICD-10-CM

## 2020-09-20 ENCOUNTER — Other Ambulatory Visit: Payer: Self-pay | Admitting: Family Medicine

## 2020-09-20 DIAGNOSIS — Z0000 Encounter for general adult medical examination without abnormal findings: Secondary | ICD-10-CM

## 2020-11-17 ENCOUNTER — Ambulatory Visit
Admission: RE | Admit: 2020-11-17 | Discharge: 2020-11-17 | Disposition: A | Discharge status: Home / Self Care | Payer: Commercial Managed Care - PPO | Source: Ambulatory Visit | Attending: Family Medicine | Admitting: Family Medicine

## 2020-11-17 ENCOUNTER — Other Ambulatory Visit: Payer: Self-pay

## 2020-11-17 DIAGNOSIS — Z0000 Encounter for general adult medical examination without abnormal findings: Secondary | ICD-10-CM

## 2021-05-11 ENCOUNTER — Ambulatory Visit (INDEPENDENT_AMBULATORY_CARE_PROVIDER_SITE_OTHER): Payer: Commercial Managed Care - PPO | Admitting: Pulmonary Disease

## 2021-05-11 ENCOUNTER — Encounter: Payer: Self-pay | Admitting: Pulmonary Disease

## 2021-05-11 ENCOUNTER — Other Ambulatory Visit: Payer: Self-pay

## 2021-05-11 VITALS — BP 122/82 | HR 77 | Temp 98.4°F | Ht 66.0 in | Wt 177.0 lb

## 2021-05-11 DIAGNOSIS — Z86010 Personal history of colonic polyps: Secondary | ICD-10-CM | POA: Insufficient documentation

## 2021-05-11 DIAGNOSIS — E785 Hyperlipidemia, unspecified: Secondary | ICD-10-CM | POA: Insufficient documentation

## 2021-05-11 DIAGNOSIS — Z23 Encounter for immunization: Secondary | ICD-10-CM

## 2021-05-11 DIAGNOSIS — R053 Chronic cough: Secondary | ICD-10-CM | POA: Diagnosis not present

## 2021-05-11 DIAGNOSIS — F419 Anxiety disorder, unspecified: Secondary | ICD-10-CM | POA: Insufficient documentation

## 2021-05-11 NOTE — Patient Instructions (Signed)
Chronic cough --ARRANGE pulmonary function tests --START omeprazole 20 mg ONE tablet TWICE a day x 6 weeks (end December 15th)  Follow-up with me after PFTs

## 2021-05-11 NOTE — Addendum Note (Signed)
Addended by: Geraldo Docker on: 05/11/2021 11:27 AM   Modules accepted: Orders

## 2021-05-11 NOTE — Progress Notes (Signed)
Subjective:   PATIENT ID: [name] GENDER: female DOB: [birth year], MRN: [id number]   HPI  Chief Complaint  Patient presents with   Consult    Chronic cough    Reason for Visit: New consult for chronic cough  Ms. Hang Ammon is a 65 year old never smoker female with no significant respiratory history who presents for chronic cough.  Initial consult 05/11/21 She is referred by [facility] Sprang at [facility] by Dr. Charise Carwin for chronic cough. Note from 12/22/20 was reviewed. She has had cough for 4 months. Not on any inhalers. She was previously seen in 2018 with [facility] by Dr. Sherene Sires for chronic cough x 2 years. PPI for month and GERD diet was recommended and cough resolved on follow-up visit.  She reports long standing history of cough for >2 years. She feels in the last several months that productive cough associated upper chest congestion/discomfort. She has tried air purifier, hot drinks, mucinex and cough syrups do not help. Her work at the department store seems dusty. She does notice cough is worse at work but still persists at home. Does not necessarily change with cough. Unsure if strong odors or cooking affects. Has not taken anti-reflux medications recently. Denies sinus or nasal congestion. Denies shortness of breath or wheezing.  Social History: Never smoker Works in [employer]) No pets in home (previously had dog, stolen)  I have personally reviewed patient's past medical/family/social history, allergies, current medications.  Past Medical History:  Diagnosis Date   Anxiety    History of colon polyps    HLD (hyperlipidemia)     Family History  Problem Relation Age of Onset   Breast cancer Paternal Aunt      Social History   Occupational History   Not on file  Tobacco Use   Smoking status: Never   Smokeless tobacco: Never  Substance and Sexual Activity   Alcohol use: Not on file   Drug use: Not on file   Sexual activity: Not on file    No  Known Allergies   Outpatient Medications Prior to Visit  Medication Sig Dispense Refill   Cholecalciferol (VITAMIN D PO) Take 1 tablet by mouth daily.     Multiple Vitamin (MULTIVITAMIN) tablet Take 1 tablet by mouth daily.     No facility-administered medications prior to visit.    Review of Systems  Constitutional:  Negative for chills, diaphoresis, fever, malaise/fatigue and weight loss.  HENT:  Negative for congestion, ear pain and sore throat.   Respiratory:  Positive for cough and sputum production. Negative for hemoptysis, shortness of breath and wheezing.   Cardiovascular:  Negative for chest pain, palpitations and leg swelling.  Gastrointestinal:  Negative for abdominal pain, heartburn and nausea.  Genitourinary:  Negative for frequency.  Musculoskeletal:  Negative for joint pain and myalgias.  Skin:  Negative for itching and rash.  Neurological:  Negative for dizziness, weakness and headaches.  Endo/Heme/Allergies:  Does not bruise/bleed easily.  Psychiatric/Behavioral:  Negative for depression. The patient is not nervous/anxious.     Objective:   Vitals:   05/11/21 0917  BP: 122/82  Pulse: 77  Temp: 98.4 F (36.9 C)  TempSrc: Oral  SpO2: 97%  Weight: 177 lb (80.3 kg)  Height: 5\' 6"  (1.676 m)     Physical Exam: General: Well-appearing, no acute distress HENT: [place], AT Eyes: EOMI, no scleral icterus Respiratory: Clear to auscultation bilaterally.  No crackles, wheezing or rales Cardiovascular: RRR, -M/R/G, no JVD Extremities:-Edema,-tenderness Neuro: AAO  x4, CNII-XII grossly intact Psych: Normal mood, normal affect  Data Reviewed:  Imaging: CXR 12/27/16 - Normal. No infiltrate, effusion or edema.  PFT: Spirometry 12/27/16 FVC 2.3 (77%) FEV1 2.3 (97%) Ratio 99  Interpretation: No obstructive defect. FVC reduced. Consider bronchodilator and/or lung volume testing to further evaluate.  Labs: CBC    Component Value Date/Time   WBC 7.1 12/27/2016 1515    RBC 5.25 (H) 12/27/2016 1515   HGB 14.3 12/27/2016 1515   HCT 43.8 12/27/2016 1515   PLT 273.0 12/27/2016 1515   MCV 83.3 12/27/2016 1515   MCHC 32.7 12/27/2016 1515   RDW 12.6 12/27/2016 1515   LYMPHSABS 1.8 12/27/2016 1515   MONOABS 0.5 12/27/2016 1515   EOSABS 0.2 12/27/2016 1515   BASOSABS 0.1 12/27/2016 1515   Absolute eos 12/27/16 - 200     Assessment & Plan:   Discussion: 65 year old female never smoker with chronic cough >2 years. Previously tried PPI with improvement per notes however patient does not recall effectiveness. No s/sx of UACS. Common causes of cough were discussed including upper airway cough syndrome, reflux and undiagnosed obstructive lung disease. Will obtain PFTs to rule out underlying obstructive and restrictive lung disease. If testing normal, then no further work-up indicated and will recommend PPI. We could also consider neurogenic cough however patient prefers to minimize pill intake if possible.  Chronic cough --ARRANGE pulmonary function tests --START omeprazole 20 mg ONE tablet TWICE a day x 6 weeks (end December 15th)   Health Maintenance Immunization History  Administered Date(s) Administered   PFIZER(Purple Top)SARS-COV-2 Vaccination 09/23/2019, 10/14/2019, 04/22/2020   Tdap 09/29/2013  Administer influenza vaccine  CT Lung Screen - not qualified. Never smoker  Orders Placed This Encounter  Procedures   Flu Vaccine QUAD 64mo+IM (Fluarix, Fluzone & Alfiuria Quad PF)  No orders of the defined types were placed in this encounter.   No follow-ups on file. After PFTs  I have spent a total time of 45-minutes on the day of the appointment reviewing prior documentation, coordinating care and discussing medical diagnosis and plan with the patient/family. Imaging, labs and tests included in this note have been reviewed and interpreted independently by me.  An Schnabel Mechele Collin, MD [facility] 05/11/2021 9:13 AM  Office Number  [phone]

## 2021-07-27 ENCOUNTER — Encounter: Payer: Commercial Managed Care - PPO | Admitting: Pulmonary Disease

## 2021-07-27 ENCOUNTER — Ambulatory Visit: Payer: Commercial Managed Care - PPO | Admitting: Pulmonary Disease

## 2021-09-21 ENCOUNTER — Ambulatory Visit: Payer: Commercial Managed Care - PPO | Admitting: Pulmonary Disease

## 2021-10-12 ENCOUNTER — Other Ambulatory Visit: Payer: Self-pay | Admitting: Family Medicine

## 2021-11-23 ENCOUNTER — Ambulatory Visit: Payer: Commercial Managed Care - PPO | Admitting: Pulmonary Disease

## 2021-11-23 NOTE — Progress Notes (Incomplete)
? ? ?Subjective:  ? ?PATIENT ID: Gabriella Greene GENDER: female DOB: 06-19-1956, MRN: [id number] ? ? ?HPI ? ?No chief complaint on file. ? ? ?Reason for Visit: Follow-up chronic cough ? ?[name] is a 66 year old never smoker female with no significant respiratory history who presents for chronic cough. ? ?Initial consult ?05/11/21 ?She is referred by [facility] by Dr. Charise Carwin for chronic cough. Note from 12/22/20 was reviewed. She has had cough for 4 months. Not on any inhalers. She was previously seen in 2018 with [facility] by Dr. Sherene Sires for chronic cough x 2 years. PPI for month and GERD diet was recommended and cough resolved on follow-up visit. ? ?She reports long standing history of cough for >2 years. She feels in the last several months that productive cough associated upper chest congestion/discomfort. She has tried air purifier, hot drinks, mucinex and cough syrups do not help. Her work at the department store seems dusty. She does notice cough is worse at work but still persists at home. Does not necessarily change with cough. Unsure if strong odors or cooking affects. Has not taken anti-reflux medications recently. Denies sinus or nasal congestion. Denies shortness of breath or wheezing. ? ?11/23/21 ? ? ?Social History: ?Never smoker ?Works in [employer]) ?No pets in home (previously had dog, stolen) ? ?I have personally reviewed patient's past medical/family/social history, allergies, current medications. ? ?Past Medical History:  ?Diagnosis Date  ?? Anxiety   ?? History of colon polyps   ?? HLD (hyperlipidemia)   ? ? ?Family History  ?Problem Relation Age of Onset  ?? Breast cancer Paternal Aunt   ?  ? ?Social History  ? ?Occupational History  ?? Not on file  ?Tobacco Use  ?? Smoking status: Never  ?? Smokeless tobacco: Never  ?Substance and Sexual Activity  ?? Alcohol use: Not on file  ?? Drug use: Not on file  ?? Sexual activity: Not on file  ? ? ?No Known Allergies   ? ?Outpatient Medications Prior to Visit  ?Medication Sig Dispense Refill  ?? Ascorbic Acid (VITAMIN C) 250 MG CHEW 1 tablet    ?? Biotin 10 MG TABS 1 tablet    ?? Cholecalciferol (VITAMIN D) 50 MCG (2000 UT) tablet 1 tablet    ?? Multiple Vitamin (MULTIVITAMIN) tablet Take 1 tablet by mouth daily.    ?? Omega 3 1000 MG CAPS 1 capsule    ? ?No facility-administered medications prior to visit.  ? ? ?Review of Systems  ?Constitutional:  Negative for chills, diaphoresis, fever, malaise/fatigue and weight loss.  ?HENT:  Negative for congestion, ear pain and sore throat.   ?Respiratory:  Positive for cough and sputum production. Negative for hemoptysis, shortness of breath and wheezing.   ?Cardiovascular:  Negative for chest pain, palpitations and leg swelling.  ?Gastrointestinal:  Negative for abdominal pain, heartburn and nausea.  ?Genitourinary:  Negative for frequency.  ?Musculoskeletal:  Negative for joint pain and myalgias.  ?Skin:  Negative for itching and rash.  ?Neurological:  Negative for dizziness, weakness and headaches.  ?Endo/Heme/Allergies:  Does not bruise/bleed easily.  ?Psychiatric/Behavioral:  Negative for depression. The patient is not nervous/anxious.   ? ? ?Objective:  ? ?There were no vitals filed for this visit. ?  ? ?Physical Exam: ?General: Well-appearing, no acute distress ?HENT: [place], AT ?Eyes: EOMI, no scleral icterus ?Respiratory: Clear to auscultation bilaterally.  No crackles, wheezing or rales ?Cardiovascular: RRR, -M/R/G, no JVD ?Extremities:-Edema,-tenderness ?Neuro: AAO x4, CNII-XII grossly  intact ?Psych: Normal mood, normal affect ? ?Data Reviewed: ? ?Imaging: ?CXR 12/27/16 - Normal. No infiltrate, effusion or edema. ? ?PFT: ?Spirometry 12/27/16 ?FVC 2.3 (77%) FEV1 2.3 (97%) Ratio 99  ?Interpretation: No obstructive defect. FVC reduced. Consider bronchodilator and/or lung volume testing to further evaluate. ? ?Labs: ?CBC ?   ?Component Value Date/Time  ? WBC 7.1 12/27/2016 1515  ? RBC  5.25 (H) 12/27/2016 1515  ? HGB 14.3 12/27/2016 1515  ? HCT 43.8 12/27/2016 1515  ? PLT 273.0 12/27/2016 1515  ? MCV 83.3 12/27/2016 1515  ? MCHC 32.7 12/27/2016 1515  ? RDW 12.6 12/27/2016 1515  ? LYMPHSABS 1.8 12/27/2016 1515  ? MONOABS 0.5 12/27/2016 1515  ? EOSABS 0.2 12/27/2016 1515  ? BASOSABS 0.1 12/27/2016 1515  ? ?Absolute eos 12/27/16 - 200 ? ?   ?Assessment & Plan:  ? ?Discussion: ?66 year old female never smoker with chronic cough >2 years. Previously tried PPI with improvement per notes however patient does not recall effectiveness. No s/sx of UACS. Common causes of cough were discussed including upper airway cough syndrome, reflux and undiagnosed obstructive lung disease. Will obtain PFTs to rule out underlying obstructive and restrictive lung disease. If testing normal, then no further work-up indicated and will recommend PPI. We could also consider neurogenic cough however patient prefers to minimize pill intake if possible. ? ?Chronic cough ?--ARRANGE pulmonary function tests ?--START omeprazole 20 mg ONE tablet TWICE a day x 6 weeks (end December 15th) ? ? ?Health Maintenance ?Immunization History  ?Administered Date(s) Administered  ?? Influenza,inj,Quad PF,6+ Mos 05/11/2021  ?? PFIZER(Purple Top)SARS-COV-2 Vaccination 09/23/2019, 10/14/2019, 04/22/2020  ?? Tdap 09/29/2013  ?Administer influenza vaccine ? ?CT Lung Screen - not qualified. Never smoker ? ?No orders of the defined types were placed in this encounter. ?No orders of the defined types were placed in this encounter. ? ? ?No follow-ups on file. After PFTs ? ?I have spent a total time of 45-minutes on the day of the appointment reviewing prior documentation, coordinating care and discussing medical diagnosis and plan with the patient/family. Imaging, labs and tests included in this note have been reviewed and interpreted independently by me. ? ?Rehman Levinson Mechele Collin, MD ?[facility] ?11/23/2021 1:48 PM  ?Office Number  [phone] ? ? ? ?

## 2021-12-02 ENCOUNTER — Other Ambulatory Visit: Payer: Self-pay | Admitting: Family Medicine

## 2021-12-02 ENCOUNTER — Ambulatory Visit
Admission: RE | Admit: 2021-12-02 | Discharge: 2021-12-02 | Disposition: A | Discharge status: Home / Self Care | Payer: Commercial Managed Care - PPO | Source: Ambulatory Visit | Attending: Family Medicine | Admitting: Family Medicine

## 2021-12-02 DIAGNOSIS — Z1231 Encounter for screening mammogram for malignant neoplasm of breast: Secondary | ICD-10-CM

## 2022-01-12 IMAGING — MG DIGITAL SCREENING BILAT W/ TOMO W/ CAD
8 series · 9 of 24 positions shown · non-contrast
Comparison: Previous exam(s).

CLINICAL DATA: Screening.

EXAM:
DIGITAL SCREENING BILATERAL MAMMOGRAM WITH TOMO AND CAD

[R CC synth-2D]
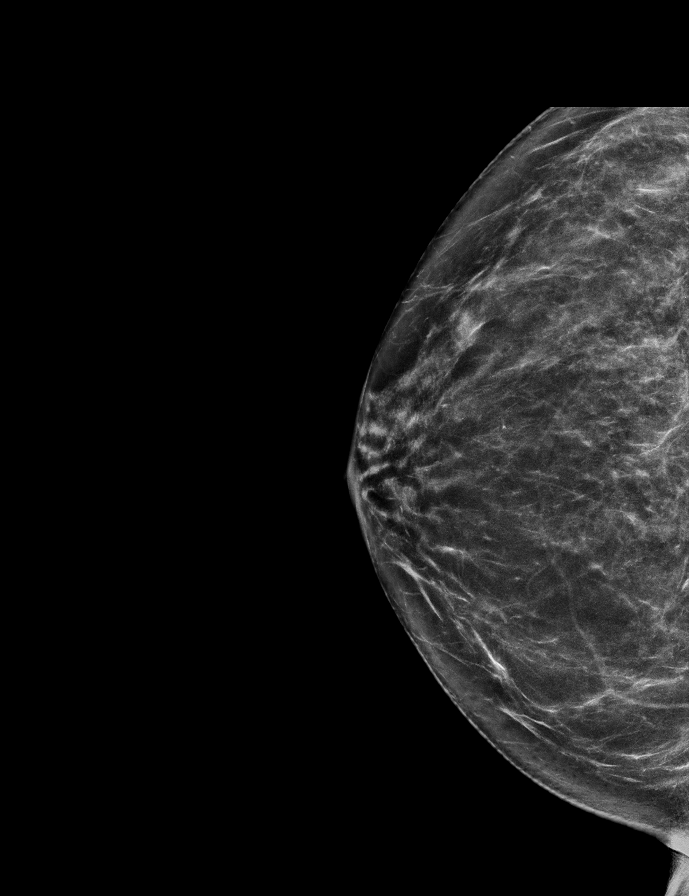

[R MLO synth-2D]
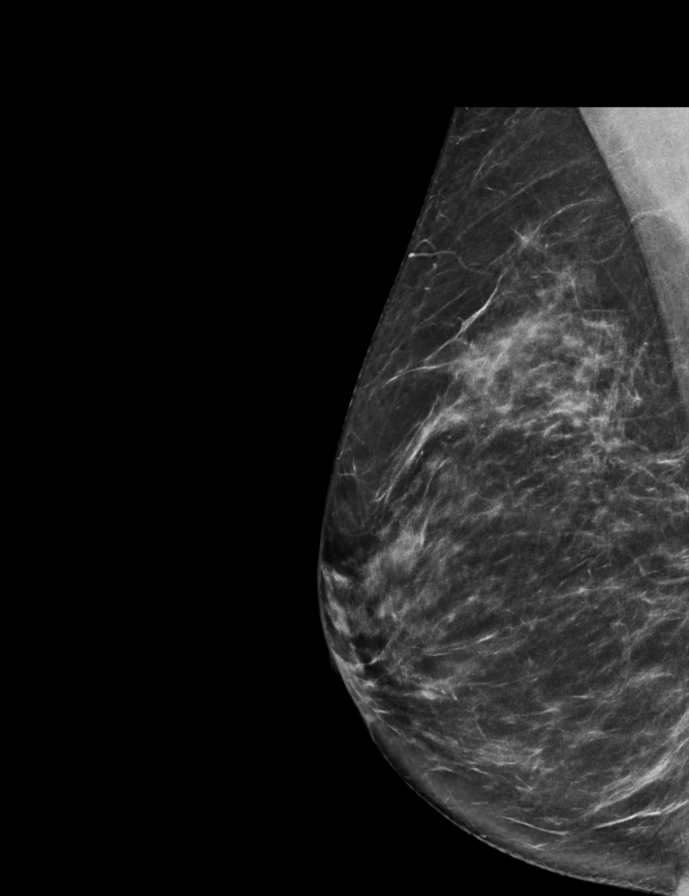

[L CC synth-2D]
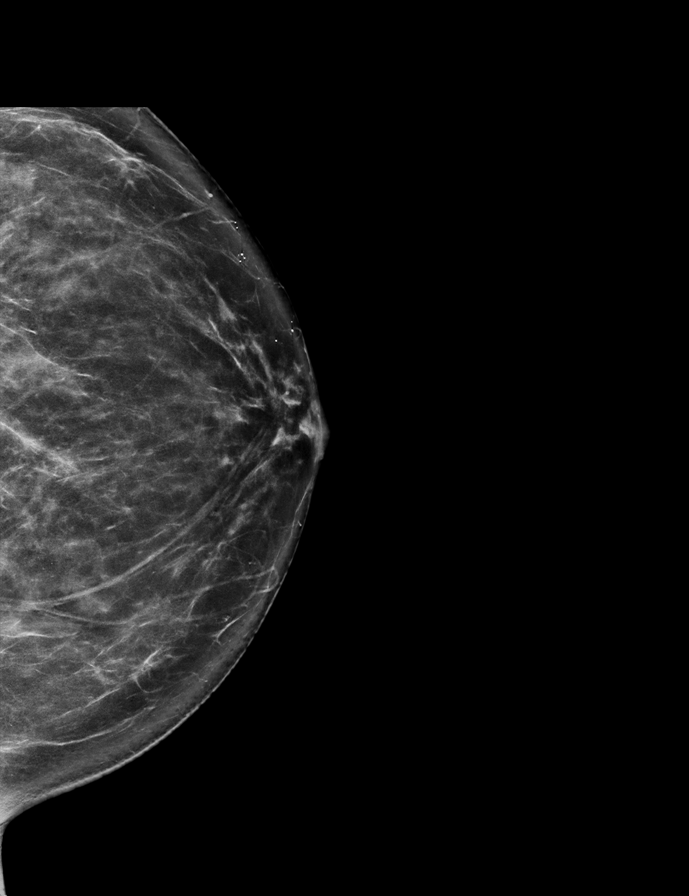

[L MLO synth-2D]
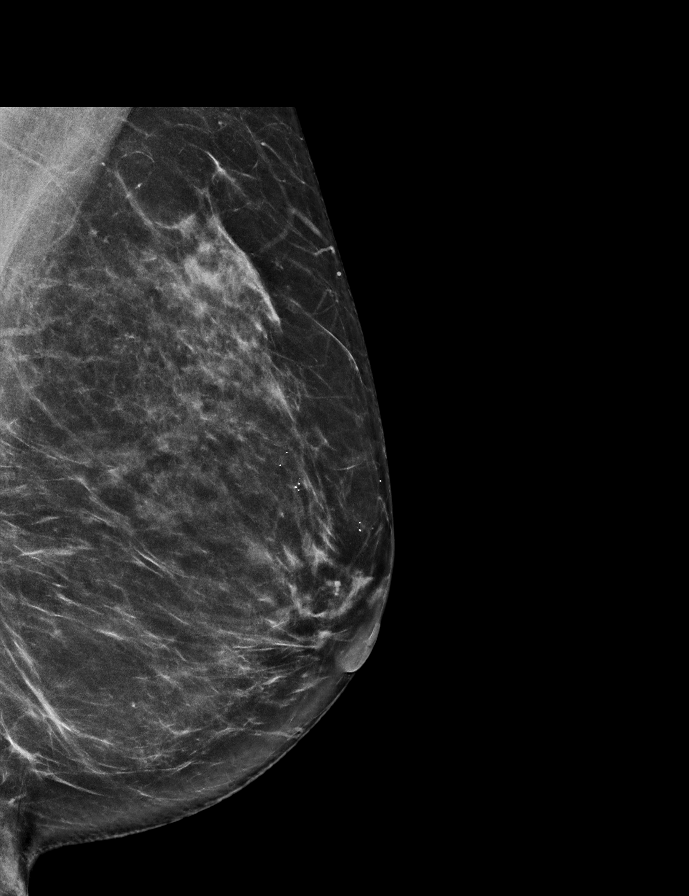

[L MLO tomo · 2 of 74 frames shown]
[frame 24/74]
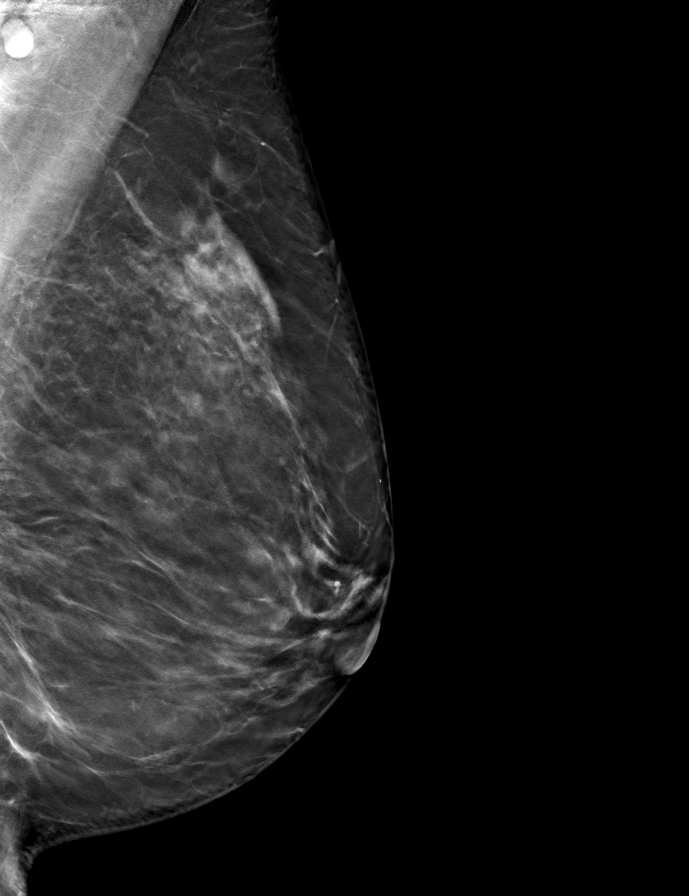
[frame 37/74]
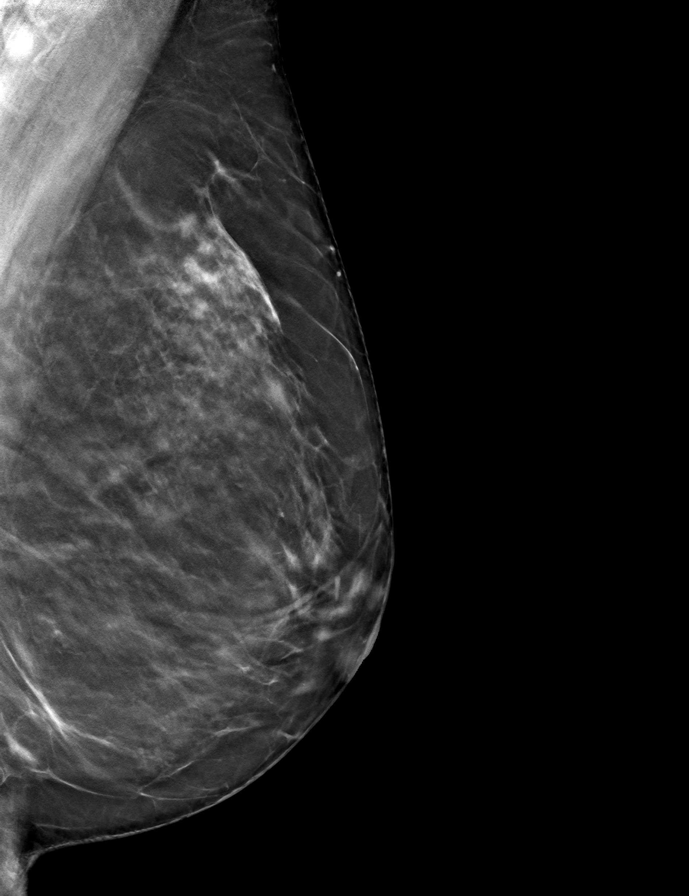

[R CC tomo · tomo slice 35/69.0]
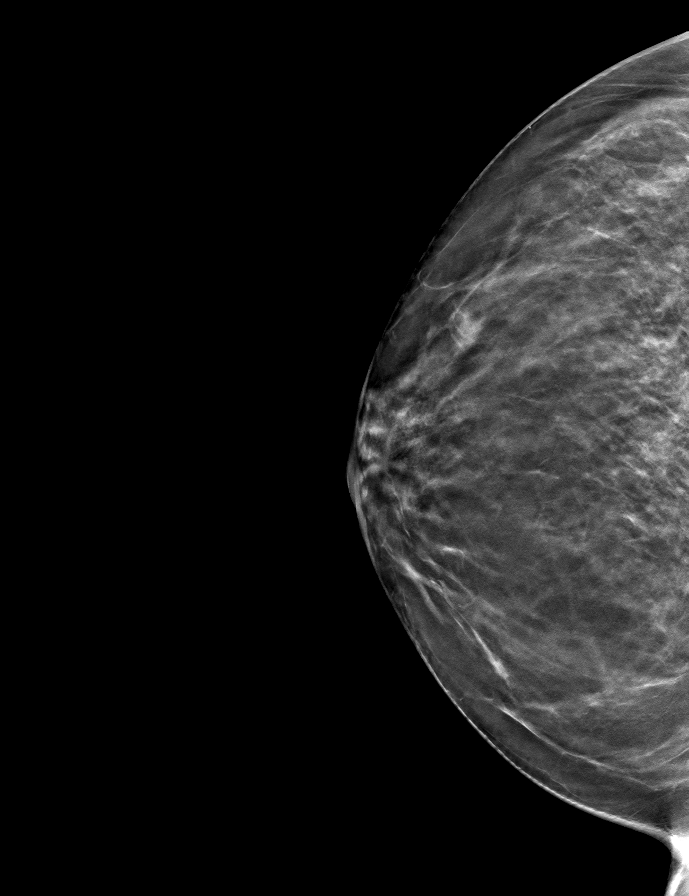

[R MLO tomo · tomo slice 36/71.0]
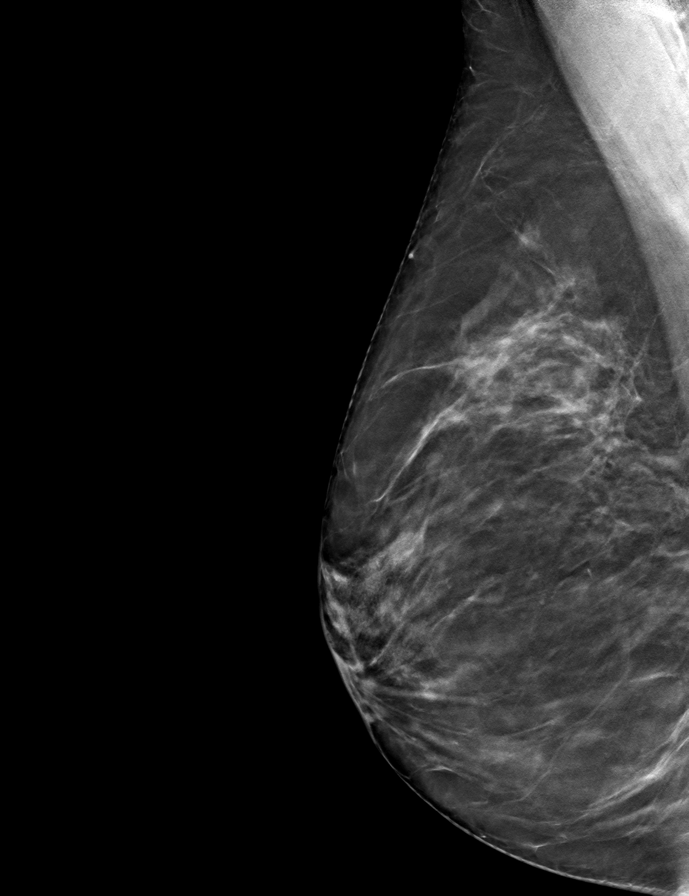

[L CC tomo · tomo slice 37/74.0]
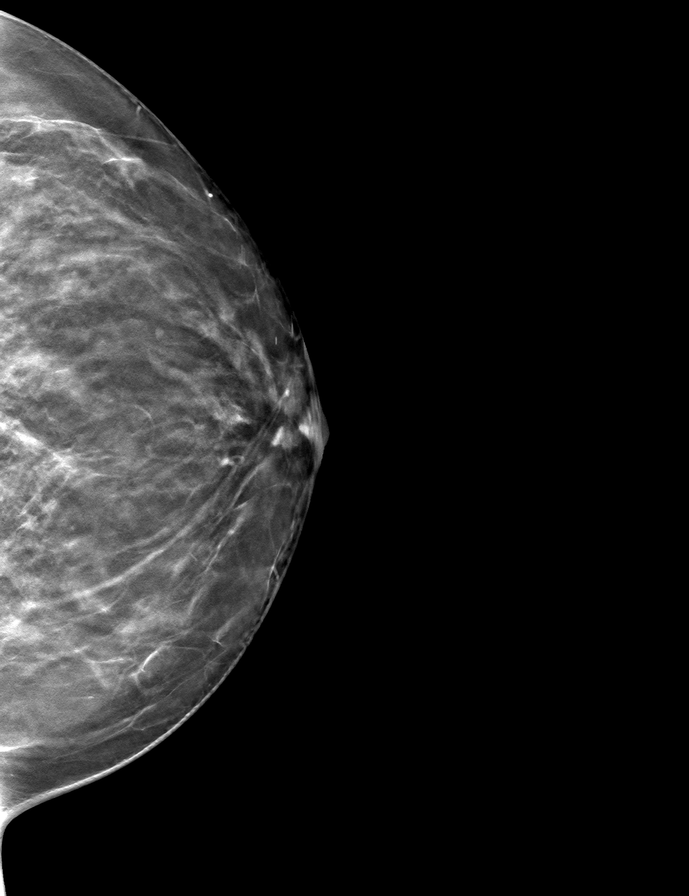

[9 of 24 positions shown; findings below may reference images not displayed]

ACR Breast Density Category b: There are scattered areas of
fibroglandular density.
FINDINGS: There are no findings suspicious for malignancy. Images were
processed with CAD.
IMPRESSION: No mammographic evidence of malignancy. A result letter of this
screening mammogram will be mailed directly to the patient.

RECOMMENDATION:
Screening mammogram in one year. (Code:CN-[id number])

BI-RADS CATEGORY  1: Negative.

## 2022-08-23 DIAGNOSIS — Z711 Person with feared health complaint in whom no diagnosis is made: Secondary | ICD-10-CM | POA: Diagnosis not present

## 2022-11-02 ENCOUNTER — Other Ambulatory Visit: Payer: Self-pay | Admitting: Family Medicine

## 2022-11-02 DIAGNOSIS — Z1231 Encounter for screening mammogram for malignant neoplasm of breast: Secondary | ICD-10-CM

## 2022-12-13 ENCOUNTER — Ambulatory Visit
Admission: RE | Admit: 2022-12-13 | Discharge: 2022-12-13 | Disposition: A | Discharge status: Home / Self Care | Payer: Medicare Other | Source: Ambulatory Visit | Attending: Family Medicine | Admitting: Family Medicine

## 2022-12-13 DIAGNOSIS — Z1231 Encounter for screening mammogram for malignant neoplasm of breast: Secondary | ICD-10-CM

## 2023-02-14 DIAGNOSIS — H2513 Age-related nuclear cataract, bilateral: Secondary | ICD-10-CM | POA: Diagnosis not present

## 2023-02-14 DIAGNOSIS — H04123 Dry eye syndrome of bilateral lacrimal glands: Secondary | ICD-10-CM | POA: Diagnosis not present

## 2023-02-14 DIAGNOSIS — H43813 Vitreous degeneration, bilateral: Secondary | ICD-10-CM | POA: Diagnosis not present

## 2023-04-11 ENCOUNTER — Other Ambulatory Visit: Payer: Self-pay | Admitting: Family Medicine

## 2023-04-11 DIAGNOSIS — E785 Hyperlipidemia, unspecified: Secondary | ICD-10-CM | POA: Diagnosis not present

## 2023-04-11 DIAGNOSIS — E2839 Other primary ovarian failure: Secondary | ICD-10-CM

## 2023-04-11 DIAGNOSIS — Z1159 Encounter for screening for other viral diseases: Secondary | ICD-10-CM | POA: Diagnosis not present

## 2023-04-11 DIAGNOSIS — Z0000 Encounter for general adult medical examination without abnormal findings: Secondary | ICD-10-CM | POA: Diagnosis not present

## 2023-11-28 ENCOUNTER — Ambulatory Visit
Admission: RE | Admit: 2023-11-28 | Discharge: 2023-11-28 | Disposition: A | Discharge status: Home / Self Care | Payer: Medicare Other | Source: Ambulatory Visit | Attending: Family Medicine | Admitting: Family Medicine

## 2023-11-28 DIAGNOSIS — N958 Other specified menopausal and perimenopausal disorders: Secondary | ICD-10-CM | POA: Diagnosis not present

## 2023-11-28 DIAGNOSIS — M8588 Other specified disorders of bone density and structure, other site: Secondary | ICD-10-CM | POA: Diagnosis not present

## 2023-11-28 DIAGNOSIS — E2839 Other primary ovarian failure: Secondary | ICD-10-CM

## 2024-02-27 DIAGNOSIS — H04123 Dry eye syndrome of bilateral lacrimal glands: Secondary | ICD-10-CM | POA: Diagnosis not present

## 2024-02-27 DIAGNOSIS — H2513 Age-related nuclear cataract, bilateral: Secondary | ICD-10-CM | POA: Diagnosis not present

## 2024-02-27 DIAGNOSIS — H43813 Vitreous degeneration, bilateral: Secondary | ICD-10-CM | POA: Diagnosis not present

## 2024-02-29 DIAGNOSIS — S61212A Laceration without foreign body of right middle finger without damage to nail, initial encounter: Secondary | ICD-10-CM | POA: Diagnosis not present

## 2024-03-03 DIAGNOSIS — S61411A Laceration without foreign body of right hand, initial encounter: Secondary | ICD-10-CM | POA: Diagnosis not present

## 2024-03-12 DIAGNOSIS — S61411D Laceration without foreign body of right hand, subsequent encounter: Secondary | ICD-10-CM | POA: Diagnosis not present

## 2024-03-27 ENCOUNTER — Other Ambulatory Visit: Payer: Self-pay | Admitting: Family Medicine

## 2024-03-27 DIAGNOSIS — Z0000 Encounter for general adult medical examination without abnormal findings: Secondary | ICD-10-CM

## 2024-04-16 ENCOUNTER — Ambulatory Visit
Admission: RE | Admit: 2024-04-16 | Discharge: 2024-04-16 | Disposition: A | Source: Ambulatory Visit | Attending: Family Medicine | Admitting: Family Medicine

## 2024-04-16 DIAGNOSIS — Z1231 Encounter for screening mammogram for malignant neoplasm of breast: Secondary | ICD-10-CM | POA: Diagnosis not present

## 2024-04-16 DIAGNOSIS — Z0000 Encounter for general adult medical examination without abnormal findings: Secondary | ICD-10-CM

## 2024-05-14 DIAGNOSIS — Z0000 Encounter for general adult medical examination without abnormal findings: Secondary | ICD-10-CM | POA: Diagnosis not present

## 2024-05-14 DIAGNOSIS — Z1331 Encounter for screening for depression: Secondary | ICD-10-CM | POA: Diagnosis not present

## 2024-05-14 DIAGNOSIS — E785 Hyperlipidemia, unspecified: Secondary | ICD-10-CM | POA: Diagnosis not present

## 2024-05-14 DIAGNOSIS — M8588 Other specified disorders of bone density and structure, other site: Secondary | ICD-10-CM | POA: Diagnosis not present

## 2024-05-30 ENCOUNTER — Ambulatory Visit
Admission: EM | Admit: 2024-05-30 | Discharge: 2024-05-30 | Disposition: A | Discharge status: Home / Self Care | Attending: Urgent Care | Admitting: Urgent Care

## 2024-05-30 DIAGNOSIS — Z23 Encounter for immunization: Secondary | ICD-10-CM

## 2024-05-30 DIAGNOSIS — S61212A Laceration without foreign body of right middle finger without damage to nail, initial encounter: Secondary | ICD-10-CM | POA: Diagnosis not present

## 2024-05-30 DIAGNOSIS — M79644 Pain in right finger(s): Secondary | ICD-10-CM | POA: Diagnosis not present

## 2024-05-30 MED ORDER — TETANUS-DIPHTH-ACELL PERTUSSIS 5-2-15.5 LF-MCG/0.5 IM SUSP
0.5000 mL | Freq: Once | INTRAMUSCULAR | Status: AC
  Administered 2024-05-30: 0.5 mL via INTRAMUSCULAR

## 2024-05-30 NOTE — ED Provider Notes (Signed)
 [facility]  Note:  This document was prepared using Conservation officer, historic buildings and may include unintentional dictation errors.  MRN: [id number] DOB: [birth year]  Subjective:   Gabriella Greene is a 68 y.o. female presenting for suffering a right middle finger laceration to the tip of her finger while she was cleaning the oven.  Patient applied a lot of turmeric to the wound and came straight to the clinic.  Last Tdap on file was from 2015.   No current facility-administered medications for this encounter.  Current Outpatient Medications:    Ascorbic Acid (VITAMIN C) 250 MG CHEW, 1 tablet, Disp: , Rfl:    Biotin 10 MG TABS, 1 tablet, Disp: , Rfl:    Cholecalciferol (VITAMIN D) 50 MCG (2000 UT) tablet, 1 tablet, Disp: , Rfl:    Multiple Vitamin (MULTIVITAMIN) tablet, Take 1 tablet by mouth daily., Disp: , Rfl:    Omega 3 1000 MG CAPS, 1 capsule, Disp: , Rfl:    No Known Allergies  Past Medical History:  Diagnosis Date   Anxiety    History of colon polyps    HLD (hyperlipidemia)      Past Surgical History:  Procedure Laterality Date   CESAREAN SECTION Bilateral    1984 and 1981    Family History  Problem Relation Age of Onset   Breast cancer Paternal Aunt     Social History   Tobacco Use   Smoking status: Never   Smokeless tobacco: Never    ROS   Objective:   Vitals: BP (!) 165/81 (BP Location: Left Arm)   Pulse 67   Temp 97.9 F (36.6 C) (Oral)   Resp 16   SpO2 97%   Physical Exam Constitutional:      General: She is not in acute distress.    Appearance: Normal appearance. She is well-developed. She is not ill-appearing, toxic-appearing or diaphoretic.  HENT:     Head: Normocephalic and atraumatic.     Nose: Nose normal.     Mouth/Throat:     Mouth: Mucous membranes are moist.  Eyes:     General: No scleral icterus.       Right eye: No discharge.        Left eye: No discharge.     Extraocular Movements:  Extraocular movements intact.  Cardiovascular:     Rate and Rhythm: Normal rate.  Pulmonary:     Effort: Pulmonary effort is normal.  Musculoskeletal:       Hands:  Skin:    General: Skin is warm and dry.  Neurological:     General: No focal deficit present.     Mental Status: She is alert and oriented to person, place, and time.  Psychiatric:        Mood and Affect: Mood normal.        Behavior: Behavior normal.    Wound cleansed, dressed.  Tdap updated in clinic.  Assessment and Plan :   PDMP not reviewed this encounter.  1. Laceration of right middle finger without foreign body without damage to nail, initial encounter   2. Finger pain, right   3. Need for Tdap vaccination    Unfortunately patient was not receptive to exam and did not contract for safety if there was an attempt to do anesthesia for laceration repair.  Therefore I advised I would not be placing sutures which the patient did agree to.  Counseled on general wound care.  Wound must heal by secondary  intention.   Christopher Savannah, PA-C 05/30/24 1751

## 2024-05-30 NOTE — Discharge Instructions (Signed)
 Change your dressing 3-5 times daily. Every time you change your dressing, clean the wound gently with warm water and Dial antibacterial soap. Pat the wound dry, let it breathe for roughly an hour before covering it back up. When you reapply a dressing, apply Bacitracin ointment to the wound, then cover with non-stick/non-adherent gauze.  This healing process may take 1-2 weeks. If you develop fever, drainage of pus or redness, then return to our clinic as these are signs of wound infection.

## 2024-05-30 NOTE — ED Triage Notes (Signed)
 Pt reports she was cleaning the oven and cut the right middle finger happened around 1600.   Pt reports last Tdap was [facility] 2025.
